# Patient Record
Sex: Female | Born: 1975 | Race: Black or African American | Hispanic: No | Marital: Single | Smoking: Current every day smoker
Health system: Southern US, Community
[De-identification: ages and names within clinical notes are randomized; demographics above are authoritative.]

## PROBLEM LIST (undated history)

## (undated) DIAGNOSIS — G43909 Migraine, unspecified, not intractable, without status migrainosus: Secondary | ICD-10-CM

## (undated) HISTORY — PX: HERNIA REPAIR: SHX51

---

## 2008-04-23 ENCOUNTER — Emergency Department (HOSPITAL_COMMUNITY): Admission: EM | Admit: 2008-04-23 | Discharge: 2008-04-23 | Payer: Self-pay | Admitting: Emergency Medicine

## 2008-12-27 IMAGING — CR DG CHEST 1V PORT
1 series · 1 of 1 positions shown · non-contrast
Comparison: No priors

CLINICAL DATA: Short of breath

PORTABLE CHEST - 1 VIEW

[view not recorded]
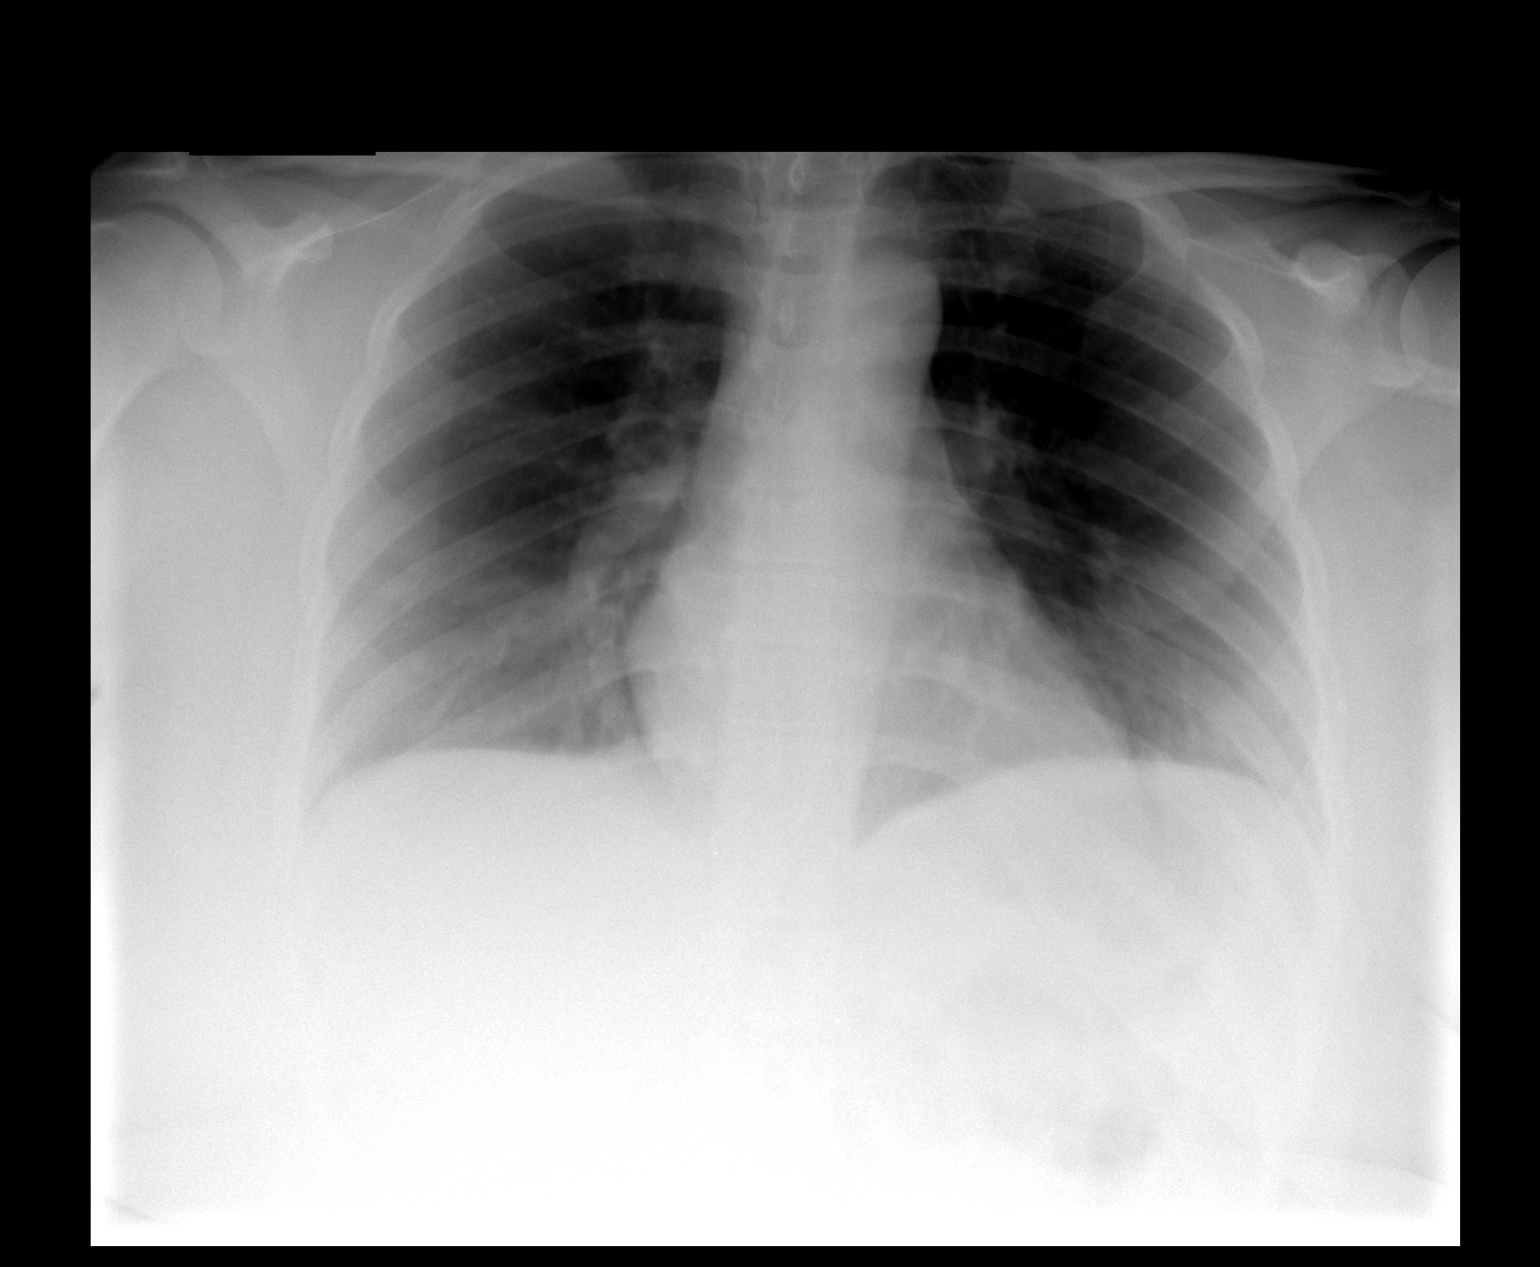

[1 of 1 positions shown; findings below may reference images not displayed]

FINDINGS: Heart and lungs within normal limits for AP projection.
No pleural fluid.  Osseous structures intact in one-view
IMPRESSION: No active disease.

## 2018-08-13 ENCOUNTER — Emergency Department (HOSPITAL_COMMUNITY): Payer: Self-pay

## 2018-08-13 ENCOUNTER — Other Ambulatory Visit: Payer: Self-pay

## 2018-08-13 ENCOUNTER — Emergency Department (HOSPITAL_COMMUNITY)
Admission: EM | Admit: 2018-08-13 | Discharge: 2018-08-13 | Disposition: A | Discharge status: Home / Self Care | Payer: Self-pay | Attending: Emergency Medicine | Admitting: Emergency Medicine

## 2018-08-13 ENCOUNTER — Encounter (HOSPITAL_COMMUNITY): Payer: Self-pay | Admitting: Emergency Medicine

## 2018-08-13 DIAGNOSIS — R0789 Other chest pain: Secondary | ICD-10-CM

## 2018-08-13 DIAGNOSIS — F17210 Nicotine dependence, cigarettes, uncomplicated: Secondary | ICD-10-CM | POA: Insufficient documentation

## 2018-08-13 DIAGNOSIS — R200 Anesthesia of skin: Secondary | ICD-10-CM | POA: Insufficient documentation

## 2018-08-13 HISTORY — DX: Migraine, unspecified, not intractable, without status migrainosus: G43.909

## 2018-08-13 LAB — CBC
HCT: 33.1 % — ABNORMAL LOW (ref 36.0–46.0)
Hemoglobin: 11.1 g/dL — ABNORMAL LOW (ref 12.0–15.0)
MCH: 30.4 pg (ref 26.0–34.0)
MCHC: 33.5 g/dL (ref 30.0–36.0)
MCV: 90.7 fL (ref 80.0–100.0)
Platelets: 329 10*3/uL (ref 150–400)
RBC: 3.65 MIL/uL — AB (ref 3.87–5.11)
RDW: 12.4 % (ref 11.5–15.5)
WBC: 5.9 10*3/uL (ref 4.0–10.5)
nRBC: 0 % (ref 0.0–0.2)

## 2018-08-13 LAB — CBG MONITORING, ED: Glucose-Capillary: 97 mg/dL (ref 70–99)

## 2018-08-13 LAB — BASIC METABOLIC PANEL
ANION GAP: 7 (ref 5–15)
BUN: 8 mg/dL (ref 6–20)
CO2: 23 mmol/L (ref 22–32)
Calcium: 8.6 mg/dL — ABNORMAL LOW (ref 8.9–10.3)
Chloride: 108 mmol/L (ref 98–111)
Creatinine, Ser: 0.79 mg/dL (ref 0.44–1.00)
GFR calc Af Amer: >60 mL/min (ref >60)
GLUCOSE: 88 mg/dL (ref 70–99)
Potassium: 3.6 mmol/L (ref 3.5–5.1)
SODIUM: 138 mmol/L (ref 135–145)

## 2018-08-13 LAB — TROPONIN I: Troponin I: <0.03 ng/mL (ref <0.03)

## 2018-08-13 LAB — I-STAT TROPONIN, ED: TROPONIN I, POC: 0 ng/mL (ref 0.00–0.08)

## 2018-08-13 MED ORDER — IBUPROFEN 800 MG PO TABS
800.0000 mg | ORAL_TABLET | Freq: Once | ORAL | Status: AC
  Administered 2018-08-13: 800 mg via ORAL
  Filled 2018-08-13: qty 1

## 2018-08-13 MED ORDER — ONDANSETRON HCL 4 MG PO TABS
4.0000 mg | ORAL_TABLET | Freq: Once | ORAL | Status: AC
  Administered 2018-08-13: 4 mg via ORAL
  Filled 2018-08-13: qty 1

## 2018-08-13 MED ORDER — TRAMADOL HCL 50 MG PO TABS: 50.0000 mg | ORAL_TABLET | Freq: Four times a day (QID) | ORAL | 0 refills | Status: DC | PRN

## 2018-08-13 MED ORDER — ACETAMINOPHEN 500 MG PO TABS
1000.0000 mg | ORAL_TABLET | Freq: Once | ORAL | Status: AC
  Administered 2018-08-13: 1000 mg via ORAL
  Filled 2018-08-13: qty 2

## 2018-08-13 NOTE — ED Provider Notes (Signed)
[facility] EMERGENCY DEPARTMENT Provider Note   CSN: [id number] Arrival date & time: 08/13/18  4098     History   Chief Complaint Chief Complaint  Patient presents with  . Chest Pain    HPI Barbaraann Avans Deyoe is a 42 y.o. female.  Patient is a 42 year old female who presents to the emergency department with complaint of chest pain.  The patient states that this problem started in the early morning hours on October 18.  She says she woke up with pain in the left chest and left arm.  She felt somewhat dizzy.  She started getting ready for work and it seems as though the pain got worse.  The pain in the chest gradually got some better, but the pain in the arm did not get better, and as time went by the fingers began to feel "sort of numb".  There was no loss of consciousness.  No unusual sweats, no nausea, no vomiting.  The patient says that she may have had a short episode of feeling short of breath, but this resolved on its own.  Patient states that she is a smoker, she uses alcohol on a regular basis, she denies the use of recreational or street drugs.  She has not been diagnosed with any cardiac or pulmonary related illness.  She states that she does not have first-degree family members with cardiac issues.  She has not had any recent injury or trauma to the chest.  She does do a lot of repetitive motion with her arms.  She presents now for assistance with this issue.     Past Medical History:  Diagnosis Date  . Migraines     There are no active problems to display for this patient.   Past Surgical History:  Procedure Laterality Date  . HERNIA REPAIR       OB History   None      Home Medications    Prior to Admission medications   Not on File    Family History No family history on file.  Social History Social History   Tobacco Use  . Smoking status: Current Every Ingrum Smoker    Packs/Tribbett: 0.50    Types: Cigarettes  . Smokeless tobacco: Never Used  Substance  Use Topics  . Alcohol use: Yes    Alcohol/week: 6.0 standard drinks    Types: 6 Cans of beer per week  . Drug use: Never     Allergies   Patient has no known allergies.   Review of Systems Review of Systems  Constitutional: Negative for activity change, appetite change and fever.       All ROS Neg except as noted in HPI  HENT: Negative for nosebleeds.   Eyes: Negative for photophobia and discharge.  Respiratory: Negative for cough, shortness of breath and wheezing.   Cardiovascular: Positive for chest pain. Negative for palpitations.  Gastrointestinal: Negative for abdominal pain, blood in stool, nausea and vomiting.  Genitourinary: Negative for dysuria, frequency and hematuria.  Musculoskeletal: Positive for arthralgias. Negative for back pain and neck pain.  Skin: Negative.   Neurological: Positive for dizziness and numbness. Negative for seizures and speech difficulty.  Psychiatric/Behavioral: Negative for confusion and hallucinations.     Physical Exam Updated Vital Signs Ht 5\' 4"  (1.626 m)   Wt 104.3 kg   LMP 07/18/2018 (Approximate)   BMI 39.48 kg/m   Physical Exam  Constitutional: She appears well-developed and well-nourished. No distress.  HENT:  Head: Normocephalic and atraumatic.  Right Ear: External ear normal.  Left Ear: External ear normal.  Eyes: Conjunctivae are normal. Right eye exhibits no discharge. Left eye exhibits no discharge. No scleral icterus.  Neck: Neck supple. No tracheal deviation present.  Cardiovascular: Normal rate, regular rhythm and intact distal pulses.  Pulmonary/Chest: Effort normal and breath sounds normal. No stridor. No respiratory distress. She has no wheezes. She has no rales.  Abdominal: Soft. Bowel sounds are normal. She exhibits no distension. There is no tenderness. There is no rebound and no guarding.  Musculoskeletal: She exhibits no edema or tenderness.  There is good range of motion of the left shoulder and elbow.   Some crepitus with range of motion of the shoulder.  There is full range of motion of the left wrist.  Hyperextension at times reproduces some of the numbness in the fingers.  The patient has a variable Tinel's sign.  Capillary refill is less than 2 seconds.  Radial pulses 2+ bilaterally.  Neurological: She is alert. She has normal strength. No cranial nerve deficit (no facial droop, extraocular movements intact, no slurred speech) or sensory deficit. She exhibits normal muscle tone. She displays no seizure activity. Coordination normal.  Skin: Skin is warm and dry. No rash noted.  Psychiatric: She has a normal mood and affect.  Nursing note and vitals reviewed.    ED Treatments / Results  Labs (all labs ordered are listed, but only abnormal results are displayed) Labs Reviewed  BASIC METABOLIC PANEL  CBC  I-STAT TROPONIN, ED    EKG None  Radiology No results found.  Procedures Procedures (including critical care time)  Medications Ordered in ED Medications - No data to display   Initial Impression / Assessment and Plan / ED Course  I have reviewed the triage vital signs and the nursing notes.  Pertinent labs & imaging results that were available during my care of the patient were reviewed by me and considered in my medical decision making (see chart for details).       Final Clinical Impressions(s) / ED Diagnoses MDM  Vital signs reviewed.  Pulse oximetry is 100% on room air.  Electrocardiogram is negative for acute STEMI or other acute dysrhythmias.  CBG is normal at 97.  Patient is awake and alert and in no apparent distress throughout the emergency department visit.  The complete blood count shows the hemoglobin and hematocrit to be slightly low, otherwise within normal limits. The initial troponin is less than 0.03  Chest x-ray shows no active cardiopulmonary disease.  Repeat troponin again is less than 0.03.  Patient fitted with a wrist splint for possible carpal  tunnel.  Patient reassured of the lab results, and EKG results, and examination findings.  Have asked patient to see her physicians at the [facility] for cardiology consultation to complete this work-up.  I have asked her to also see orthopedics concerning possible carpal tunnel syndrome involving her left wrist.  Patient is to return to the emergency department immediately if any changes in condition, problems, or concerns.   Final diagnoses:  Atypical chest pain    ED Discharge Orders         Ordered    traMADol (ULTRAM) 50 MG tablet  Every 6 hours PRN     08/13/18 1407           Ivery Quale, PA-C 08/14/18 1610    Eber Hong, MD 08/14/18 1158

## 2018-08-13 NOTE — Discharge Instructions (Addendum)
Your heart enzymes are negative for acute problem.  Your chest x-ray is negative.  Your oxygen level is 100% on room air.  Please use Tylenol every 4 hours for mild pain.  Please use Ultram for more severe pain.  Please see your orthopedic specialist for evaluation of possible carpal tunnel syndrome involving your wrist forearm area.  Please see your primary physician or return to the emergency department if any changes in your condition, problems, or concerns.  Please schedule appointment with your primary physician specifically concerning the atypical chest pain that you have experienced.

## 2018-08-13 NOTE — ED Triage Notes (Addendum)
Pt reports she has left arm pain that started yesterday states its achy and finger tips feel "sort of numb." Woke up this morning was slightly dizzy and started getting ready for work when she started having chest pain around 4:30 am.

## 2018-09-24 ENCOUNTER — Emergency Department (HOSPITAL_COMMUNITY)
Admission: EM | Admit: 2018-09-24 | Discharge: 2018-09-24 | Disposition: A | Discharge status: Home / Self Care | Payer: Self-pay | Attending: Emergency Medicine | Admitting: Emergency Medicine

## 2018-09-24 ENCOUNTER — Other Ambulatory Visit: Payer: Self-pay

## 2018-09-24 ENCOUNTER — Encounter (HOSPITAL_COMMUNITY): Payer: Self-pay | Admitting: Emergency Medicine

## 2018-09-24 DIAGNOSIS — R519 Headache, unspecified: Secondary | ICD-10-CM

## 2018-09-24 DIAGNOSIS — R51 Headache: Secondary | ICD-10-CM | POA: Insufficient documentation

## 2018-09-24 DIAGNOSIS — F17210 Nicotine dependence, cigarettes, uncomplicated: Secondary | ICD-10-CM | POA: Insufficient documentation

## 2018-09-24 LAB — URINALYSIS, ROUTINE W REFLEX MICROSCOPIC
Bilirubin Urine: NEGATIVE
Glucose, UA: NEGATIVE mg/dL
HGB URINE DIPSTICK: NEGATIVE
Ketones, ur: NEGATIVE mg/dL
Leukocytes, UA: NEGATIVE
NITRITE: NEGATIVE
Protein, ur: NEGATIVE mg/dL
SPECIFIC GRAVITY, URINE: 1.016 (ref 1.005–1.030)
pH: 6 (ref 5.0–8.0)

## 2018-09-24 LAB — COMPREHENSIVE METABOLIC PANEL
ALT: 19 U/L (ref 0–44)
AST: 18 U/L (ref 15–41)
Albumin: 3.8 g/dL (ref 3.5–5.0)
Alkaline Phosphatase: 54 U/L (ref 38–126)
Anion gap: 5 (ref 5–15)
BUN: 12 mg/dL (ref 6–20)
CHLORIDE: 109 mmol/L (ref 98–111)
CO2: 22 mmol/L (ref 22–32)
Calcium: 8.3 mg/dL — ABNORMAL LOW (ref 8.9–10.3)
Creatinine, Ser: 0.57 mg/dL (ref 0.44–1.00)
GFR calc Af Amer: >60 mL/min (ref >60)
Glucose, Bld: 84 mg/dL (ref 70–99)
POTASSIUM: 3.9 mmol/L (ref 3.5–5.1)
Sodium: 136 mmol/L (ref 135–145)
Total Bilirubin: 0.7 mg/dL (ref 0.3–1.2)
Total Protein: 6.9 g/dL (ref 6.5–8.1)

## 2018-09-24 LAB — CBC WITH DIFFERENTIAL/PLATELET
ABS IMMATURE GRANULOCYTES: 0.01 10*3/uL (ref 0.00–0.07)
BASOS PCT: 1 %
Basophils Absolute: 0.1 10*3/uL (ref 0.0–0.1)
EOS PCT: 10 %
Eosinophils Absolute: 0.5 10*3/uL (ref 0.0–0.5)
HCT: 35.2 % — ABNORMAL LOW (ref 36.0–46.0)
HEMOGLOBIN: 11.9 g/dL — AB (ref 12.0–15.0)
Immature Granulocytes: 0 %
Lymphocytes Relative: 40 %
Lymphs Abs: 2 10*3/uL (ref 0.7–4.0)
MCH: 30.7 pg (ref 26.0–34.0)
MCHC: 33.8 g/dL (ref 30.0–36.0)
MCV: 90.7 fL (ref 80.0–100.0)
MONO ABS: 0.4 10*3/uL (ref 0.1–1.0)
MONOS PCT: 7 %
NEUTROS ABS: 2.1 10*3/uL (ref 1.7–7.7)
Neutrophils Relative %: 42 %
PLATELETS: 312 10*3/uL (ref 150–400)
RBC: 3.88 MIL/uL (ref 3.87–5.11)
RDW: 12.4 % (ref 11.5–15.5)
WBC: 5.1 10*3/uL (ref 4.0–10.5)
nRBC: 0 % (ref 0.0–0.2)

## 2018-09-24 MED ORDER — KETOROLAC TROMETHAMINE 30 MG/ML IJ SOLN
30.0000 mg | Freq: Once | INTRAMUSCULAR | Status: AC
  Administered 2018-09-24: 30 mg via INTRAVENOUS
  Filled 2018-09-24: qty 1

## 2018-09-24 MED ORDER — TRAMADOL HCL 50 MG PO TABS: 50.0000 mg | ORAL_TABLET | Freq: Four times a day (QID) | ORAL | 0 refills | Status: AC | PRN

## 2018-09-24 MED ORDER — METOCLOPRAMIDE HCL 5 MG/ML IJ SOLN
10.0000 mg | Freq: Once | INTRAMUSCULAR | Status: AC
  Administered 2018-09-24: 10 mg via INTRAVENOUS
  Filled 2018-09-24: qty 2

## 2018-09-24 MED ORDER — DIPHENHYDRAMINE HCL 50 MG/ML IJ SOLN
25.0000 mg | Freq: Once | INTRAMUSCULAR | Status: AC
  Administered 2018-09-24: 25 mg via INTRAVENOUS
  Filled 2018-09-24: qty 1

## 2018-09-24 NOTE — ED Provider Notes (Signed)
[facility] EMERGENCY DEPARTMENT Provider Note   CSN: [id number] Arrival date & time: 09/24/18  1211     History   Chief Complaint Chief Complaint  Patient presents with  . Migraine    HPI Tonya Mitchell is a 42 y.o. female.  Pt with a headache.    Patient also states that she has some foul-smelling urine.  Patient has a history of migraine headaches  The history is provided by the patient. No language interpreter was used.  Migraine  This is a new problem. The current episode started yesterday. The problem occurs constantly. The problem has not changed since onset.Associated symptoms include headaches. Pertinent negatives include no chest pain and no abdominal pain. Nothing aggravates the symptoms. Nothing relieves the symptoms. She has tried nothing for the symptoms. The treatment provided no relief.    Past Medical History:  Diagnosis Date  . Migraines     There are no active problems to display for this patient.   Past Surgical History:  Procedure Laterality Date  . HERNIA REPAIR       OB History    Gravida  2   Para  2   Term  2   Preterm      AB      Living        SAB      TAB      Ectopic      Multiple      Live Births               Home Medications    Prior to Admission medications   Medication Sig Start Date End Date Taking? Authorizing Provider  traMADol (ULTRAM) 50 MG tablet Take 1 tablet (50 mg total) by mouth every 6 (six) hours as needed. 09/24/18   Bethann BerkshireZammit, Brenan [place], MD    Family History Family History  Problem Relation Age of Onset  . Heart disease Other   . Diabetes Other     Social History Social History   Tobacco Use  . Smoking status: Current Every Dunaj Smoker    Packs/Iwanicki: 0.50    Types: Cigarettes  . Smokeless tobacco: Never Used  Substance Use Topics  . Alcohol use: Not Currently    Alcohol/week: 6.0 standard drinks    Types: 6 Cans of beer per week    Frequency: Never  . Drug use: Never      Allergies   Patient has no known allergies.   Review of Systems Review of Systems  Constitutional: Negative for appetite change and fatigue.  HENT: Negative for congestion, ear discharge and sinus pressure.   Eyes: Negative for discharge.  Respiratory: Negative for cough.   Cardiovascular: Negative for chest pain.  Gastrointestinal: Negative for abdominal pain and diarrhea.  Genitourinary: Negative for frequency and hematuria.  Musculoskeletal: Negative for back pain.  Skin: Negative for rash.  Neurological: Positive for headaches. Negative for seizures.  Psychiatric/Behavioral: Negative for hallucinations.     Physical Exam Updated Vital Signs BP 109/66   Pulse (!) 50   Temp 97.6 F (36.4 C) (Oral)   Resp 20   Ht 5' 4.5" (1.638 m)   Wt 105.2 kg   LMP 09/04/2018   SpO2 99%   BMI 39.21 kg/m   Physical Exam  Constitutional: She is oriented to person, place, and time. She appears well-developed.  HENT:  Head: Normocephalic.  Eyes: Conjunctivae and EOM are normal. No scleral icterus.  Neck: Neck supple. No thyromegaly present.  Cardiovascular: Normal  rate and regular rhythm. Exam reveals no gallop and no friction rub.  No murmur heard. Pulmonary/Chest: No stridor. She has no wheezes. She has no rales. She exhibits no tenderness.  Abdominal: She exhibits no distension. There is no tenderness. There is no rebound.  Musculoskeletal: Normal range of motion. She exhibits no edema.  Lymphadenopathy:    She has no cervical adenopathy.  Neurological: She is oriented to person, place, and time. She exhibits normal muscle tone. Coordination normal.  Skin: No rash noted. No erythema.  Psychiatric: She has a normal mood and affect. Her behavior is normal.     ED Treatments / Results  Labs (all labs ordered are listed, but only abnormal results are displayed) Labs Reviewed  CBC WITH DIFFERENTIAL/PLATELET - Abnormal; Notable for the following components:      Result  Value   Hemoglobin 11.9 (*)    HCT 35.2 (*)    All other components within normal limits  COMPREHENSIVE METABOLIC PANEL - Abnormal; Notable for the following components:   Calcium 8.3 (*)    All other components within normal limits  URINALYSIS, ROUTINE W REFLEX MICROSCOPIC    EKG None  Radiology No results found.  Procedures Procedures (including critical care time)  Medications Ordered in ED Medications  ketorolac (TORADOL) 30 MG/ML injection 30 mg (30 mg Intravenous Given 09/24/18 1338)  metoCLOPramide (REGLAN) injection 10 mg (10 mg Intravenous Given 09/24/18 1339)  diphenhydrAMINE (BENADRYL) injection 25 mg (25 mg Intravenous Given 09/24/18 1338)     Initial Impression / Assessment and Plan / ED Course  I have reviewed the triage vital signs and the nursing notes.  Pertinent labs & imaging results that were available during my care of the patient were reviewed by me and considered in my medical decision making (see chart for details).     Labs unremarkable.  Patient improved with migraine cocktail.  She will follow-up as needed  Final Clinical Impressions(s) / ED Diagnoses   Final diagnoses:  Bad headache    ED Discharge Orders         Ordered    traMADol (ULTRAM) 50 MG tablet  Every 6 hours PRN     09/24/18 1536           Bethann Berkshire, MD 09/25/18 [phone]

## 2018-09-24 NOTE — ED Notes (Signed)
Out of bed to BR 

## 2018-09-24 NOTE — ED Notes (Signed)
Pt with complaint of headache   Neuro intact  Ambulates without stagger or drift Non photophobic  Here w fam  Reports usually goes to [facility], "they usually give me a shot, run me thru the machine and send me to a neurologist" Has never gone to neuro due to no insurance, no money per pt

## 2018-09-24 NOTE — ED Triage Notes (Signed)
Patient reports a headache that started Thursday. No nausea or vomiting. Has history of same.

## 2018-09-24 NOTE — ED Notes (Signed)
Dr Zammit in to assess 

## 2018-09-24 NOTE — Discharge Instructions (Addendum)
Follow-up with your doctor if any more problems. °

## 2019-04-18 IMAGING — DX DG CHEST 2V
2 series · 2 of 2 positions shown · non-contrast
Comparison: 04/23/2008

CLINICAL DATA: Chest pain

EXAM:
CHEST - 2 VIEW

[chest pa]
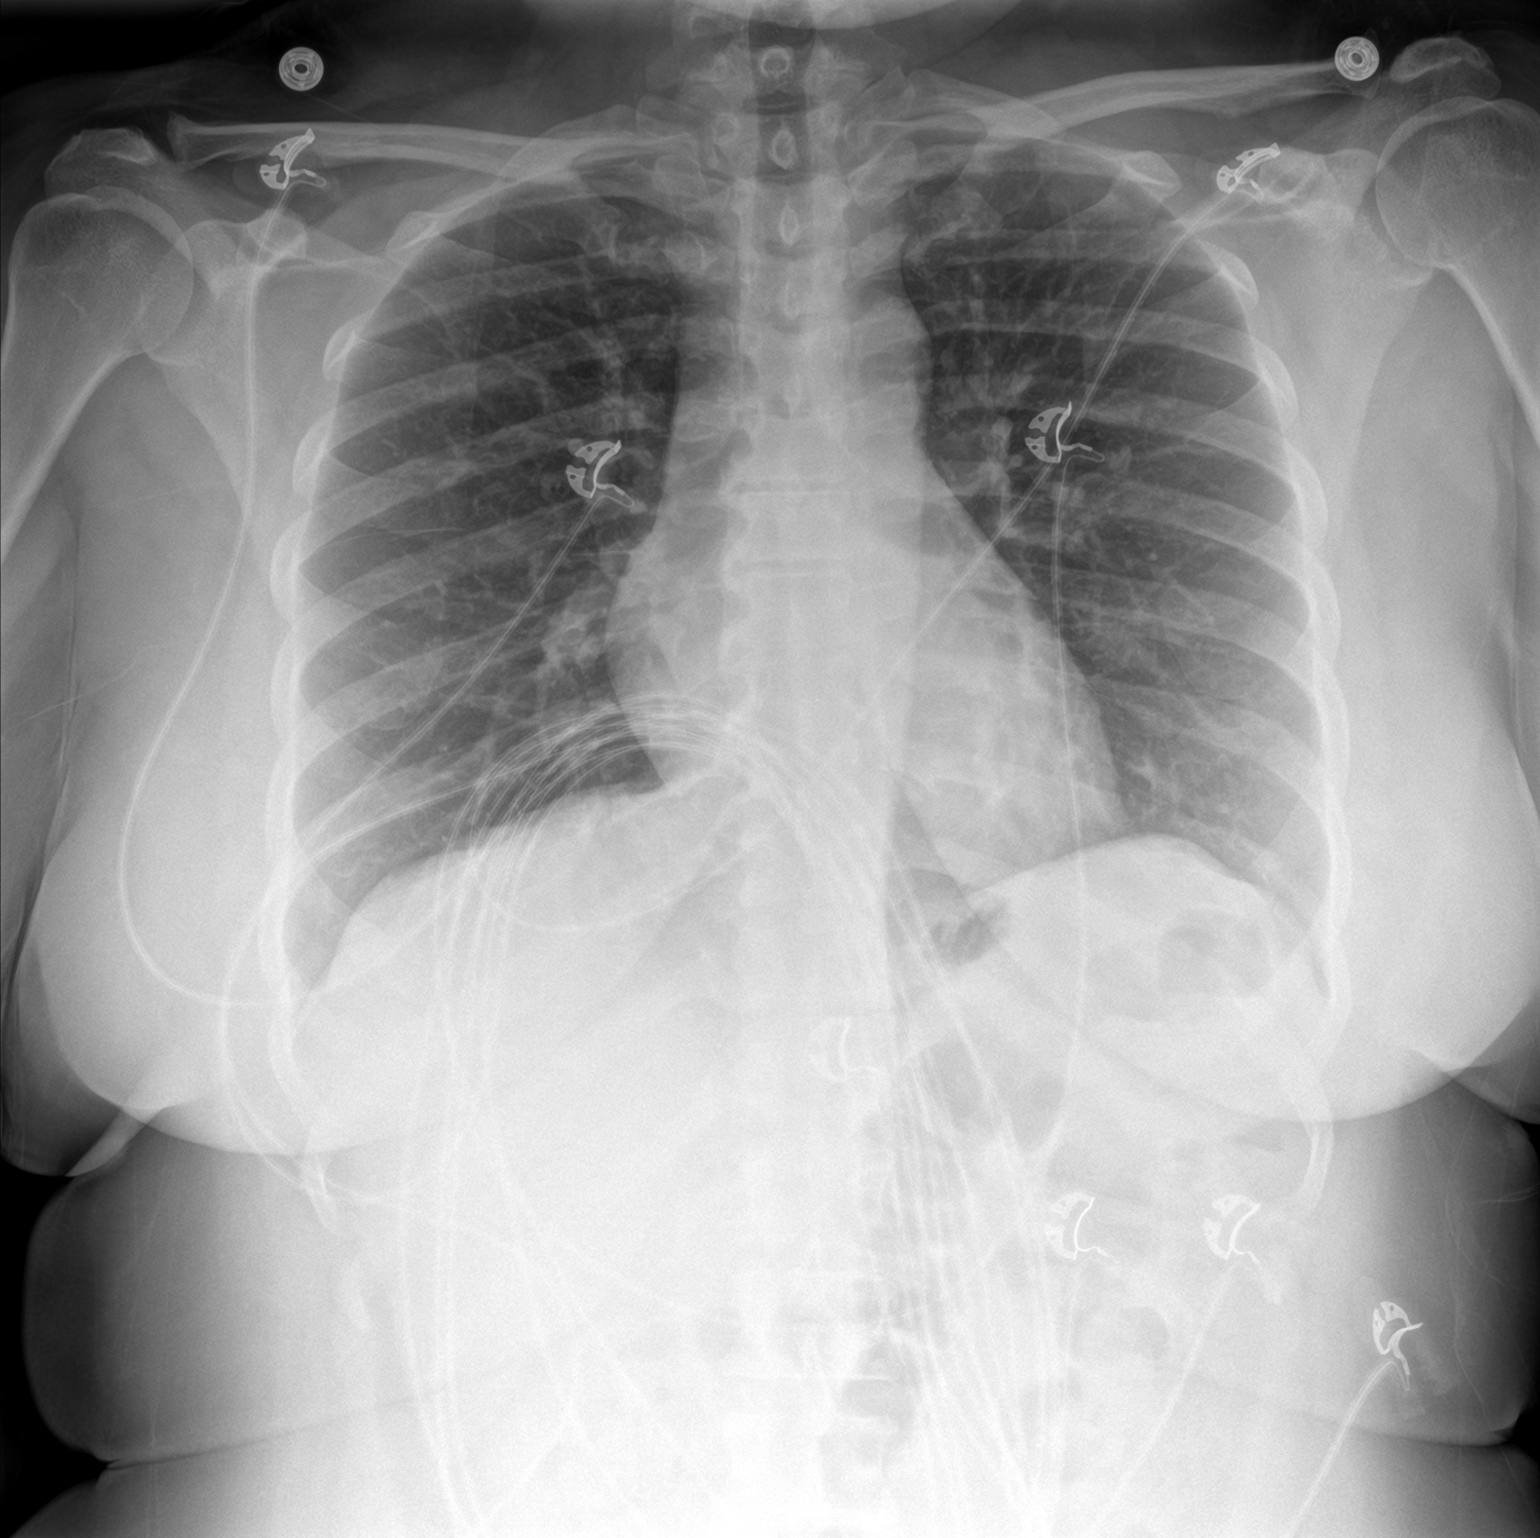

[chest lat]
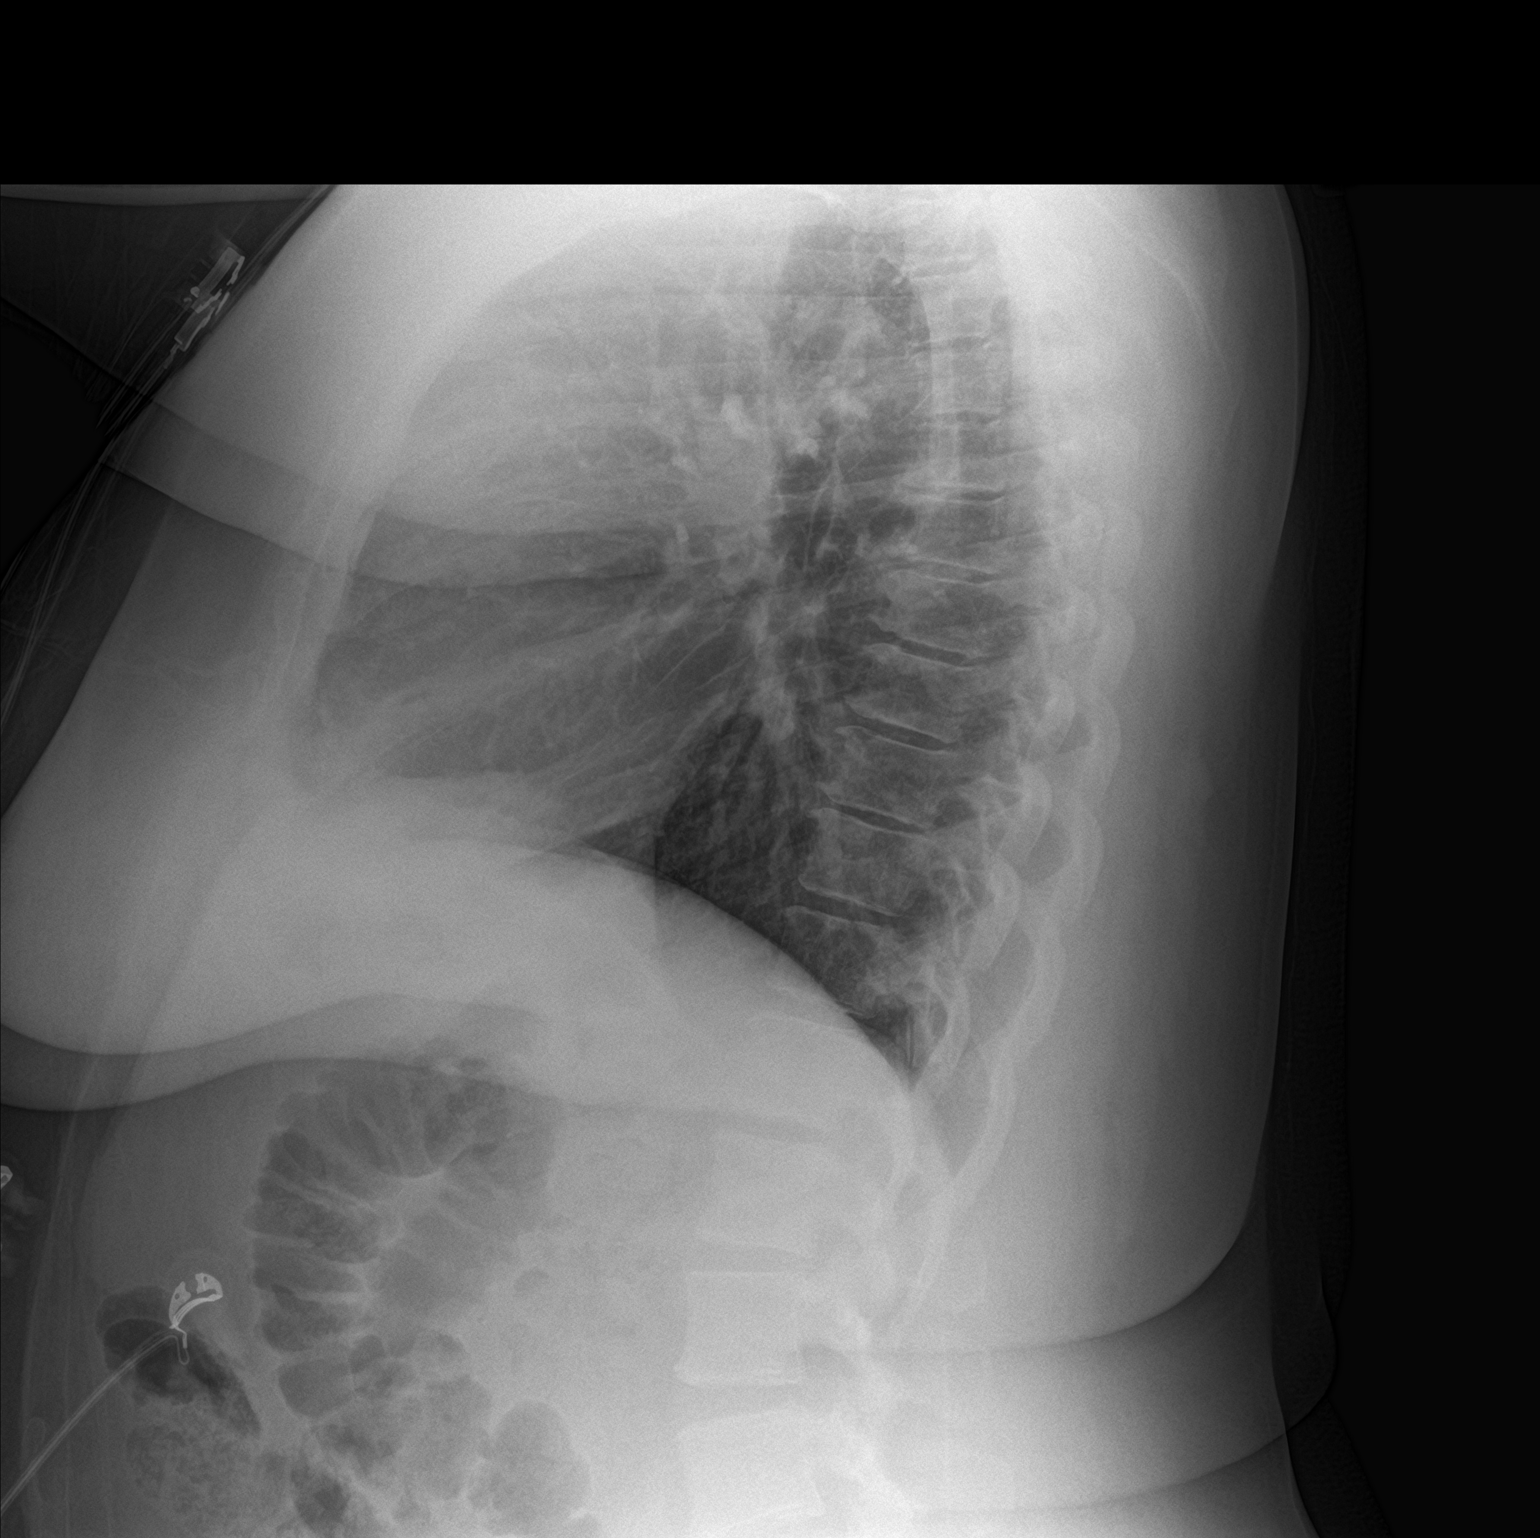

[2 of 2 positions shown; findings below may reference images not displayed]

FINDINGS: The heart size and mediastinal contours are within normal limits.
Both lungs are clear. The visualized skeletal structures are
unremarkable.
IMPRESSION: No active cardiopulmonary disease.

## 2021-09-05 ENCOUNTER — Other Ambulatory Visit (HOSPITAL_COMMUNITY): Payer: Self-pay | Admitting: Family Medicine

## 2021-09-05 DIAGNOSIS — Z1231 Encounter for screening mammogram for malignant neoplasm of breast: Secondary | ICD-10-CM

## 2022-11-13 DIAGNOSIS — E0590 Thyrotoxicosis, unspecified without thyrotoxic crisis or storm: Secondary | ICD-10-CM | POA: Diagnosis not present
